# Patient Record
Sex: Male | Born: 1985 | Race: White | Hispanic: No | Marital: Single
Health system: Southern US, Community
[De-identification: ages and names within clinical notes are randomized; demographics above are authoritative.]

---

## 2006-05-02 ENCOUNTER — Emergency Department (HOSPITAL_COMMUNITY): Admission: EM | Admit: 2006-05-02 | Discharge: 2006-05-02 | Payer: Self-pay | Admitting: Emergency Medicine

## 2010-03-01 ENCOUNTER — Encounter: Payer: Self-pay | Admitting: Cardiology

## 2010-04-10 ENCOUNTER — Ambulatory Visit: Payer: Self-pay | Admitting: Cardiology

## 2010-04-10 DIAGNOSIS — R55 Syncope and collapse: Secondary | ICD-10-CM | POA: Insufficient documentation

## 2010-04-12 LAB — CONVERTED CEMR LAB
BUN: 12 mg/dL (ref 6–23)
Basophils Absolute: 0 10*3/uL (ref 0.0–0.1)
GFR calc non Af Amer: 106.54 mL/min (ref >60)
Hemoglobin: 16.6 g/dL (ref 13.0–17.0)
Lymphocytes Relative: 25.9 % (ref 12.0–46.0)
Monocytes Relative: 7.6 % (ref 3.0–12.0)
Neutro Abs: 3.3 10*3/uL (ref 1.4–7.7)
Neutrophils Relative %: 64.9 % (ref 43.0–77.0)
Platelets: 224 10*3/uL (ref 150.0–400.0)
Potassium: 4.1 meq/L (ref 3.5–5.1)
RDW: 12.9 % (ref 11.5–14.6)
TSH: 1.18 microintl units/mL (ref 0.35–5.50)

## 2010-04-26 ENCOUNTER — Encounter: Payer: Self-pay | Admitting: Cardiology

## 2010-04-26 ENCOUNTER — Ambulatory Visit: Payer: Self-pay | Admitting: Internal Medicine

## 2010-04-26 ENCOUNTER — Ambulatory Visit: Payer: Self-pay

## 2010-04-26 ENCOUNTER — Ambulatory Visit (HOSPITAL_COMMUNITY): Admission: RE | Admit: 2010-04-26 | Discharge: 2010-04-26 | Payer: Self-pay | Admitting: Cardiology

## 2010-05-22 ENCOUNTER — Encounter: Payer: Self-pay | Admitting: Cardiology

## 2010-05-25 ENCOUNTER — Encounter: Payer: Self-pay | Admitting: Cardiology

## 2010-05-25 ENCOUNTER — Ambulatory Visit: Payer: Self-pay | Admitting: Cardiology

## 2010-10-25 NOTE — Miscellaneous (Signed)
  Clinical Lists Changes  Observations: Added new observation of ECHOINTERP: Left ventricle: The cavity size was normal. Wall thickness was     normal. Systolic function was normal. The estimated ejection     fraction was in the range of 55% to 60%. Wall motion was normal;     there were no regional wall motion abnormalities.  (04/26/2010 10:02)      Echocardiogram  Procedure date:  04/26/2010  Findings:      Left ventricle: The cavity size was normal. Wall thickness was     normal. Systolic function was normal. The estimated ejection     fraction was in the range of 55% to 60%. Wall motion was normal;     there were no regional wall motion abnormalities.

## 2010-10-25 NOTE — Assessment & Plan Note (Signed)
Summary: np6/syncope/jss   Visit Type:  Initial Consult Primary Provider:  Dr Rodrigo Ran  CC:  syncope.  History of Present Illness: The patient presents for evaluation of syncope. He has had 3 episodes. The first was in 2007  He was shadowing his primary physician in the hospital. He felt hot and had a frank syncopal episode. He had a slight prodrome and then lost consciousness. He was seen in our emergency room. I do not see any blood work or orthostatics at that time. He was felt to be vagal. He had another episode about a year later. Again he was standing and felt presyncopal. He lost consciousness. He was never seen to have seizure activity or loss of bowel or bladder. The most recent was in May of this year. At this time he was at a restaurant and had had 3 or 4 beers. He felt somewhat lightheaded and went to the restroom where he had a syncopal episode. He came out of the restroom after waking up and felt lightheaded falling and striking his head. He was seen at [facility] and had an EKG and CT scan he was told were normal. He was told he was dehydrated. Since that time he has had no further symptoms.  He is active exercising occasionally. He was a Theatre manager for many years. He never had any limitations as a child or young adult. He cannot bring on presyncope. He does have some mild orthostatic symptoms. He will occasionally notice his heart racing but this is fleeting. This is not connected with his syncope. He never has chest pressure, neck or arm discomfort. He never has shortness of breath, PND or orthopnea. He has no weight gain or swelling.  Current Medications (verified): 1)  Pt. Does Not Take Any Meds  Allergies (verified): 1)  ! Sulfa  Past History:  Past Medical History: None  Past Surgical History: Precancerous lesion resected from the left shoulder Tonsillectomy  Family History: His father has borderline diabetes. His mother died at age 80 of  melanoma. There is no family history of syncope, sudden cardiac death, heart failure or early coronary artery disease.  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 He previously played soccer at a high level. He drinks alcohol socially.  Review of Systems       As stated in the HPI and negative for all other systems.   Vital Signs:  Patient profile:   25 year old male Height:      72 inches Weight:      172.50 pounds BMI:     23.48 Pulse rate:   60 / minute Pulse (ortho):   78 / minute Pulse rhythm:   regular Resp:     18 per minute BP standing:   128 / 81  Vitals Entered By: Vikki Ports (April 10, 2010 12:12 PM)  Serial Vital Signs/Assessments:  Time      Position  BP       Pulse  Resp  Temp     By 12:19 PM  Lying LA  129/80   65                    Luz Jaramillo 12:20 PM  Sitting   123/77   58                    Luz Jaramillo 12:21 PM  Standing  128/81  78                    Luz Jaramillo 12:23 PM  Standing  116/81   85                    Luz Jaramillo 12:26 PM  Standing  126/82   87                    Vikki Ports   Physical Exam  General:  Well developed, well nourished, in no acute distress. Head:  normocephalic and atraumatic Eyes:  PERRLA/EOM intact; conjunctiva and lids normal. Mouth:  Teeth, gums and palate normal. Oral mucosa normal. Neck:  Neck supple, no JVD. No masses, thyromegaly or abnormal cervical nodes. Chest Wall:  no deformities or breast masses noted Lungs:  Clear bilaterally to auscultation and percussion. Abdomen:  Bowel sounds positive; abdomen soft and non-tender without masses, organomegaly, or hernias noted. No hepatosplenomegaly. Msk:  Back normal, normal gait. Muscle strength and tone normal. Extremities:  No clubbing or cyanosis. Neurologic:  Alert and oriented x 3. Skin:  Intact without lesions or rashes. Cervical Nodes:  no significant adenopathy Axillary Nodes:  no significant  adenopathy Inguinal Nodes:  no significant adenopathy Psych:  Normal affect.   Detailed Cardiovascular Exam  Neck    Carotids: Carotids full and equal bilaterally without bruits.      Neck Veins: Normal, no JVD.    Heart    Inspection: no deformities or lifts noted.      Palpation: normal PMI with no thrills palpable.      Auscultation: regular rate and rhythm, S1, S2 without murmurs, rubs, gallops, or clicks.    Vascular    Abdominal Aorta: no palpable masses, pulsations, or audible bruits.      Femoral Pulses: normal femoral pulses bilaterally.      Pedal Pulses: normal pedal pulses bilaterally.      Radial Pulses: normal radial pulses bilaterally.      Peripheral Circulation: no clubbing, cyanosis, or edema noted with normal capillary refill.     EKG  Procedure date:  04/10/2010  Findings:      Sinus rhythm with sinus arrhythmia, early repolarization changes, poor anterior R-wave progression, no acute ST-T wave changes  Impression & Recommendations:  Problem # 1:  SYNCOPE (ICD-780.2) The patient has syncopal episodes as described. These are most likely vagal. However, I will order some routine labs and an echocardiogram. I will have a low threshold for event monitoring given his palpitations. We did discuss avoidance and treatment of vagal syncope. Orders: EKG w/ Interpretation (93000) TLB-BMP (Basic Metabolic Panel-BMET) (80048-METABOL) TLB-TSH (Thyroid Stimulating Hormone) (84443-TSH) TLB-CBC Platelet - w/Differential (85025-CBCD) Echocardiogram (Echo)  Patient Instructions: 1)  Your physician recommends that you schedule a follow-up appointment in: 6 weeks with Dr Antoine Poche 2)  Your physician recommends that you have lab work today 3)  Your physician recommends that you continue on your current medications as directed. Please refer to the Current Medication list given to you today. 4)  Your physician has requested that you have an echocardiogram.  Echocardiography  is a painless test that uses sound waves to create images of your heart. It provides your doctor with information about the size and shape of your heart and how well your heart's chambers and valves are working.  This procedure takes approximately one hour. There are no restrictions for this procedure.

## 2010-10-25 NOTE — Letter (Signed)
Summary: [facility] Phone Note   [facility] Phone Note   Imported By: Roderic Ovens 04/18/2010 09:36:40  _____________________________________________________________________  External Attachment:    Type:   Image     Comment:   External Document

## 2010-10-25 NOTE — Assessment & Plan Note (Signed)
Summary: [facility]   Visit Type:  Follow-up Primary Melinda Gwinner:  Dr Rodrigo Ran  CC:  syncope.  History of Present Illness: The patient presents for followup of the above. Since I last saw him he has had no frank syncopal episodes. He did have an episode of near syncope while riding roller coasters. He continues to get some mild orthostatic symptoms. He has not noticed any palpitations, presyncope or syncope. We did have a long discussion today about anxiety as he does report he's got stress in his life. He thinks he does become anxious. He is not having any chest pressure or shortness of breath.  Current Medications (verified): 1)  Pt. Does Not Take Any Meds  Allergies (verified): 1)  ! Sulfa  Past History:  Past Medical History: Reviewed history from 04/10/2010 and no changes required. Syncope Vertigo  Review of Systems       As stated in the HPI and negative for all other systems.   Vital Signs:  Patient profile:   25 year old male Height:      72 inches Weight:      162 pounds BMI:     22.05 Pulse rate:   62 / minute Resp:     16 per minute BP sitting:   142 / 87  (right arm)  Vitals Entered By: Marrion Coy, CNA (May 25, 2010 2:26 PM)  Physical Exam  General:  Well developed, well nourished, in no acute distress. Head:  normocephalic and atraumatic Eyes:  PERRLA/EOM intact; conjunctiva and lids normal. Neck:  Neck supple, no JVD. No masses, thyromegaly or abnormal cervical nodes. Chest Wall:  no deformities or breast masses noted Lungs:  Clear bilaterally to auscultation and percussion. Heart:  Non-displaced PMI, chest non-tender; regular rate and rhythm, S1, S2 without murmurs, rubs or gallops. Carotid upstroke normal, no bruit. Normal abdominal aortic size, no bruits. Femorals normal pulses, no bruits. Pedals normal pulses. No edema, no varicosities. Abdomen:  Bowel sounds positive; abdomen soft and non-tender without masses, organomegaly, or  hernias noted. No hepatosplenomegaly. Msk:  Back normal, normal gait. Muscle strength and tone normal. Pulses:  pulses normal in all 4 extremities Extremities:  No clubbing or cyanosis. Neurologic:  Alert and oriented x 3. Skin:  Intact without lesions or rashes. Cervical Nodes:  no significant adenopathy Psych:  Normal affect.   Impression & Recommendations:  Problem # 1:  SYNCOPE (ICD-780.2) I think this patient is having vagal syncope. I think this is coupled with a generalized anxiety disorder. We spent a long time discussing this and I will call his primary Refoel Palladino to further discuss. He also thinks he has anxiety issues. He also describes generalized fatigue and a lower sense of well-being since he stopped exercising routinely. We spent quite a bit of time discussing this and the benefits of an exercise regimen such as league soccer or running. I encouraged him to explore this as a component of his therapy. However, I do not think he has any structural heart disease or risk of sudden cardiac death. No further cardiovascular testing is suggested.  Patient Instructions: 1)  Your physician recommends that you schedule a follow-up appointment as needed 2)  Your physician recommends that you continue on your current medications as directed. Please refer to the Current Medication list given to you today.

## 2010-12-09 ENCOUNTER — Emergency Department (HOSPITAL_COMMUNITY)
Admission: EM | Admit: 2010-12-09 | Discharge: 2010-12-10 | Disposition: A | Discharge status: Home / Self Care | Payer: 59 | Attending: Emergency Medicine | Admitting: Emergency Medicine

## 2010-12-09 ENCOUNTER — Emergency Department (HOSPITAL_COMMUNITY): Payer: 59

## 2010-12-09 DIAGNOSIS — R1013 Epigastric pain: Secondary | ICD-10-CM | POA: Insufficient documentation

## 2010-12-09 DIAGNOSIS — R112 Nausea with vomiting, unspecified: Secondary | ICD-10-CM | POA: Insufficient documentation

## 2010-12-09 LAB — CBC
HCT: 46.9 % (ref 39.0–52.0)
Hemoglobin: 17.3 g/dL — ABNORMAL HIGH (ref 13.0–17.0)
MCV: 87.8 fL (ref 78.0–100.0)
Platelets: 178 10*3/uL (ref 150–400)
RBC: 5.34 MIL/uL (ref 4.22–5.81)
WBC: 12.5 10*3/uL — ABNORMAL HIGH (ref 4.0–10.5)

## 2010-12-09 LAB — DIFFERENTIAL
Lymphocytes Relative: 1 % — ABNORMAL LOW (ref 12–46)
Lymphs Abs: 0.2 10*3/uL — ABNORMAL LOW (ref 0.7–4.0)
Neutro Abs: 11.8 10*3/uL — ABNORMAL HIGH (ref 1.7–7.7)
Neutrophils Relative %: 95 % — ABNORMAL HIGH (ref 43–77)

## 2010-12-09 LAB — COMPREHENSIVE METABOLIC PANEL
AST: 21 U/L (ref 0–37)
CO2: 23 mEq/L (ref 19–32)
Calcium: 9.7 mg/dL (ref 8.4–10.5)
Creatinine, Ser: 1.07 mg/dL (ref 0.4–1.5)
GFR calc Af Amer: >60 mL/min (ref >60)
GFR calc non Af Amer: >60 mL/min (ref >60)
Total Protein: 7.5 g/dL (ref 6.0–8.3)

## 2011-12-13 IMAGING — US US ABDOMEN COMPLETE
1 series · 14 of 25 positions shown · non-contrast
Comparison: None

CLINICAL DATA: Abdominal pain

COMPLETE ABDOMINAL ULTRASOUND

[Series 1: us abdomen complete · 0.28mm/px · 14 of 71 slices shown]
[im 1/71]
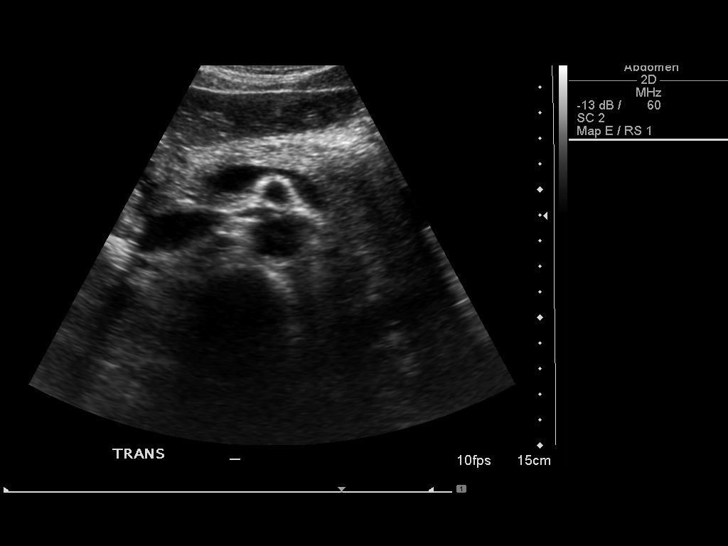
[im 6/71]
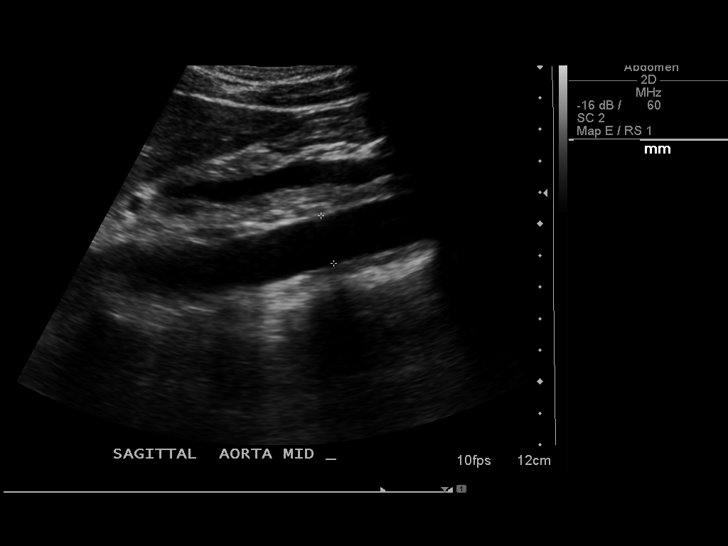
[im 12/71]
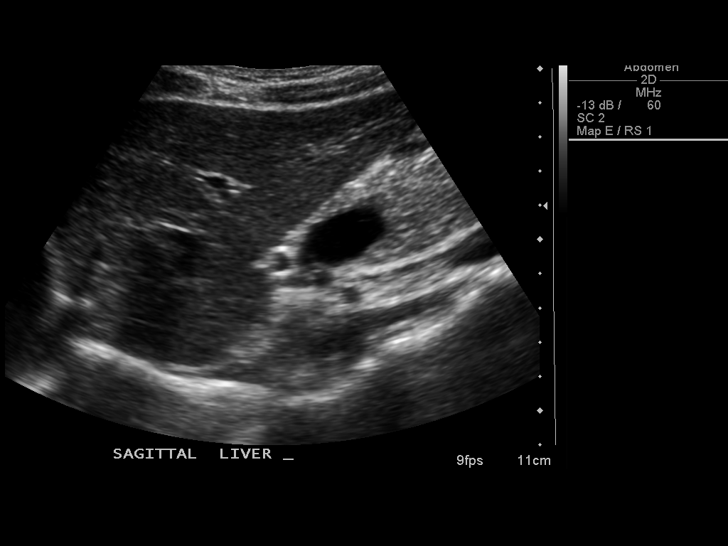
[im 18/71]
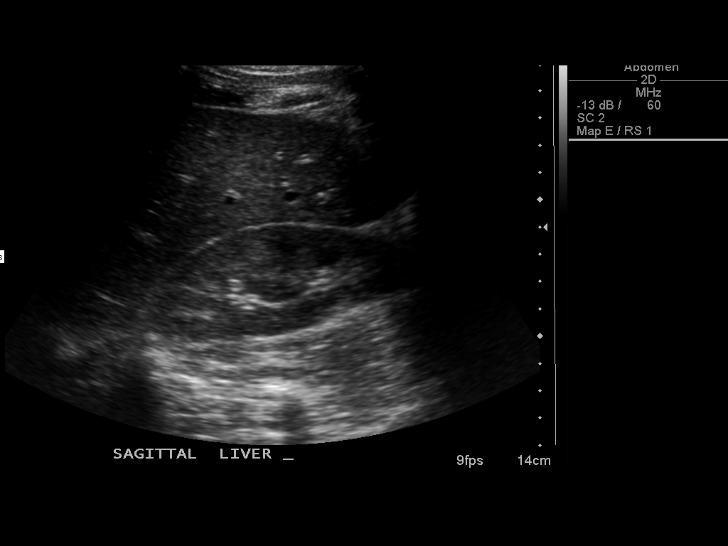
[im 24/71]
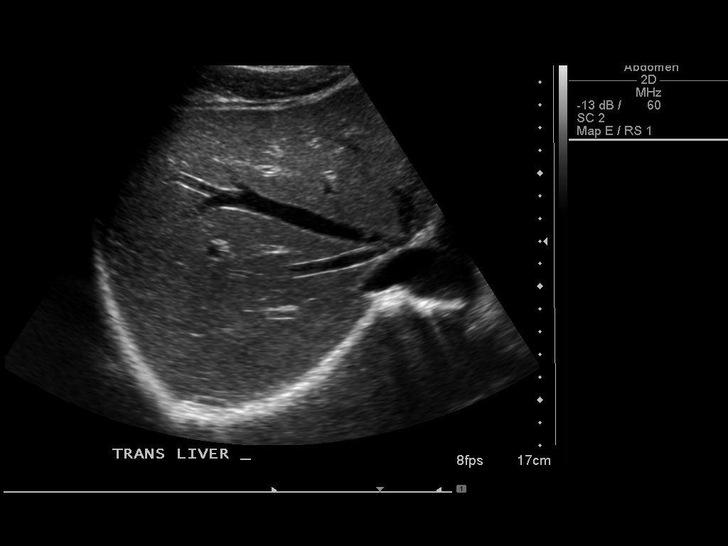
[im 27/71]
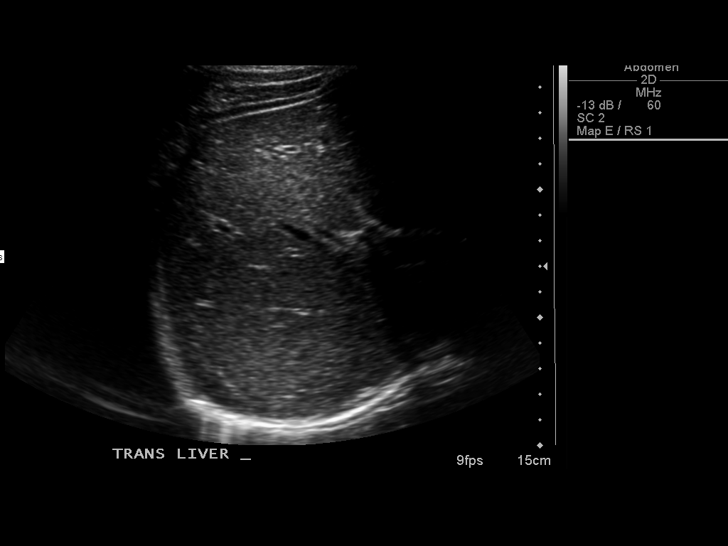
[im 33/71]
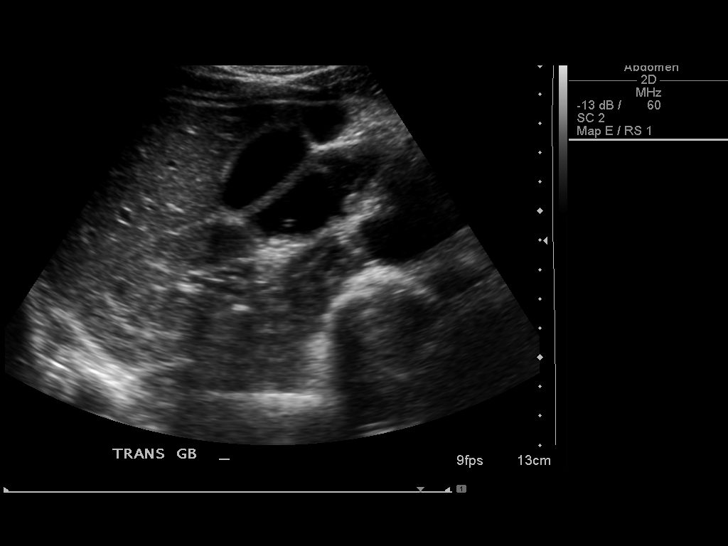
[im 38/71]
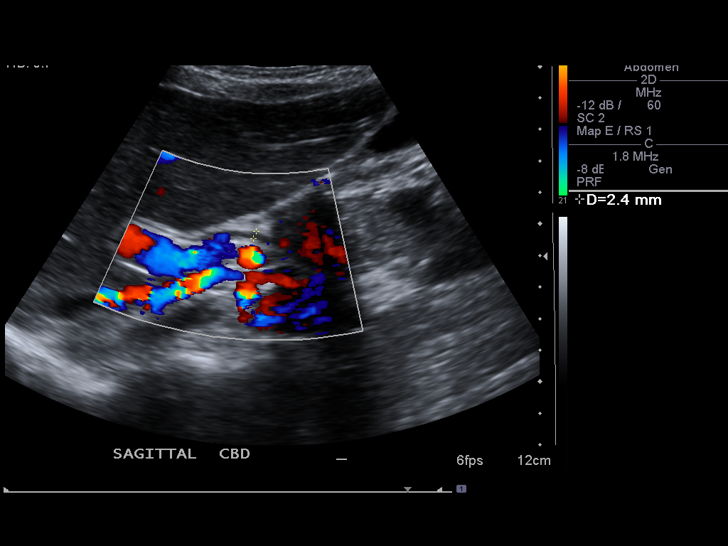
[im 44/71]
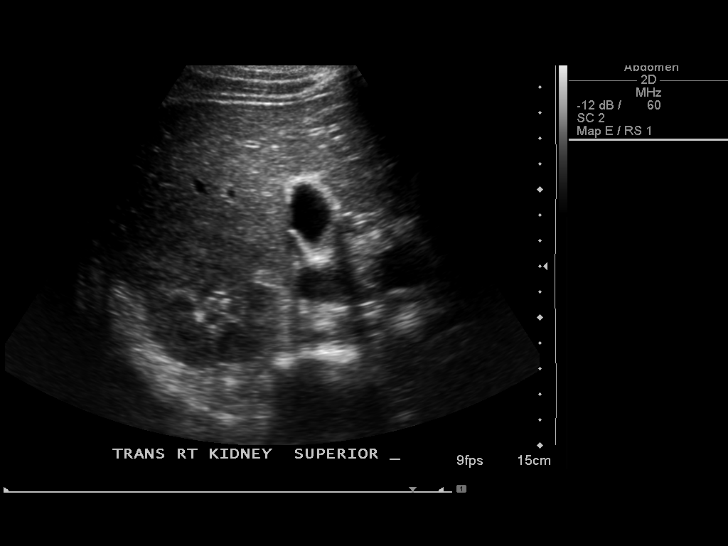
[im 47/71]
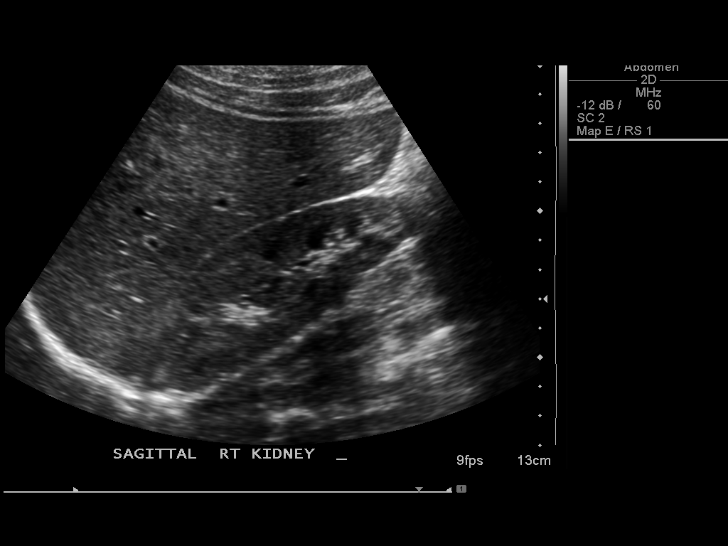
[im 53/71]
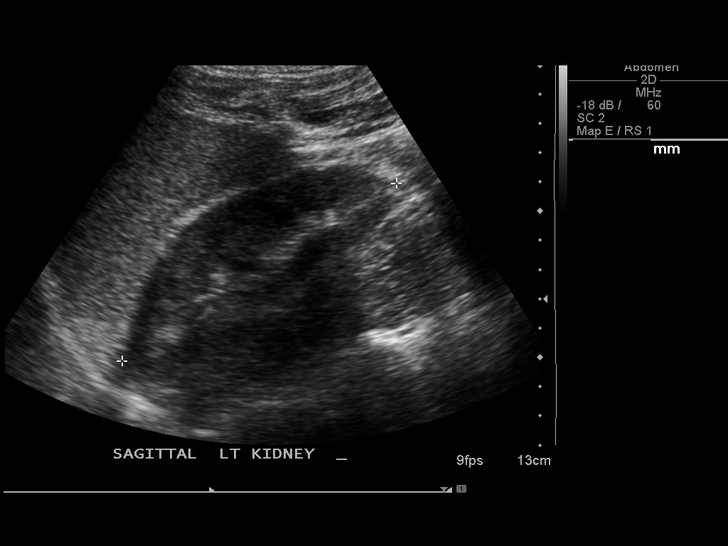
[im 59/71]
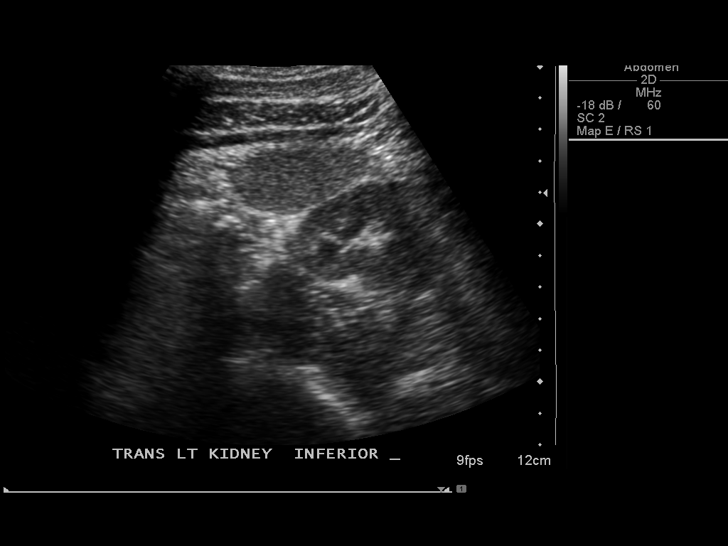
[im 65/71]
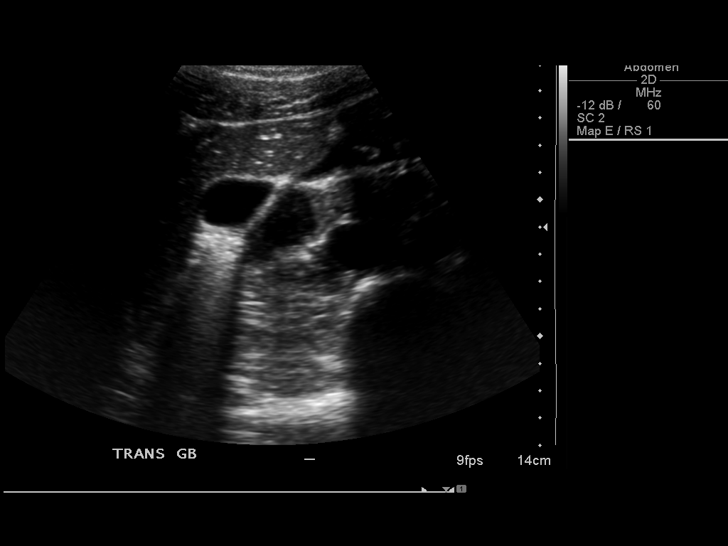
[im 71/71]
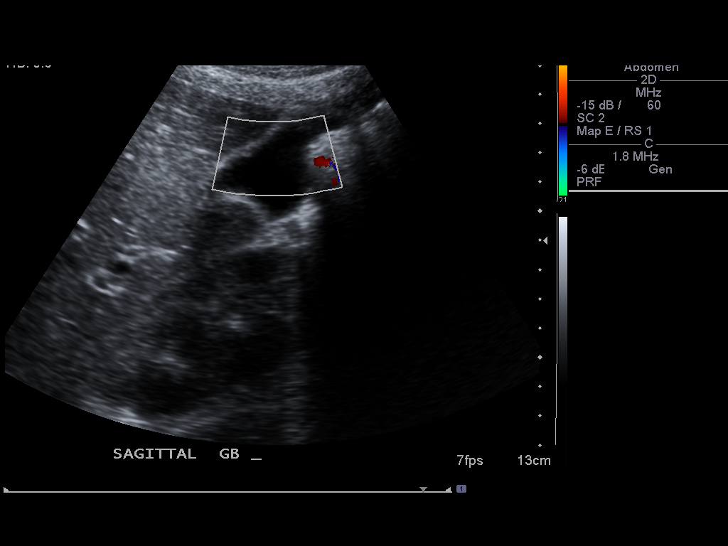

[14 of 25 positions shown; findings below may reference images not displayed]

FINDINGS: Gallbladder:  3 mm polyp versus less likely nonshadowing adherent
stone identified within the gallbladder. Sonographic Murphy's sign
was not elicited.  No wall thickening or pericholecystic fluid.

Common bile duct: Normal, 2 mm

Liver: Negative

IVC: Negative

Pancreas:  Negative

Spleen:  Borderline splenomegaly.  12.2 x 12.7 x 5.2 cm.

Right Kidney:  11.3 cm. No hydronephrosis.

Left Kidney:  11.2 cm. No hydronephrosis.

Abdominal aorta:  Nonaneurysmal without ascites.
IMPRESSION: 1.  No acute process or explanation for abdominal pain.
2.  Gallbladder polyp versus less likely adherent nonshadowing
stone.  No evidence of acute cholecystitis.
3.  Borderline splenomegaly.

## 2015-03-02 ENCOUNTER — Ambulatory Visit (HOSPITAL_COMMUNITY)
Admission: RE | Admit: 2015-03-02 | Discharge: 2015-03-02 | Disposition: A | Discharge status: Home / Self Care | Payer: 59 | Source: Ambulatory Visit | Attending: Orthopedic Surgery | Admitting: Orthopedic Surgery

## 2015-03-02 ENCOUNTER — Other Ambulatory Visit (HOSPITAL_COMMUNITY): Payer: Self-pay | Admitting: Orthopedic Surgery

## 2015-03-02 DIAGNOSIS — Z1389 Encounter for screening for other disorder: Secondary | ICD-10-CM | POA: Diagnosis present

## 2015-03-02 DIAGNOSIS — Z139 Encounter for screening, unspecified: Secondary | ICD-10-CM

## 2016-03-05 IMAGING — CR DG ORBITS FOR FOREIGN BODY
2 series · 2 of 2 positions shown · non-contrast
Comparison: None.

CLINICAL DATA: Metal working/exposure; clearance prior to MRI

EXAM:
ORBITS FOR FOREIGN BODY - 2 VIEW

[w waters (1 of 2)]
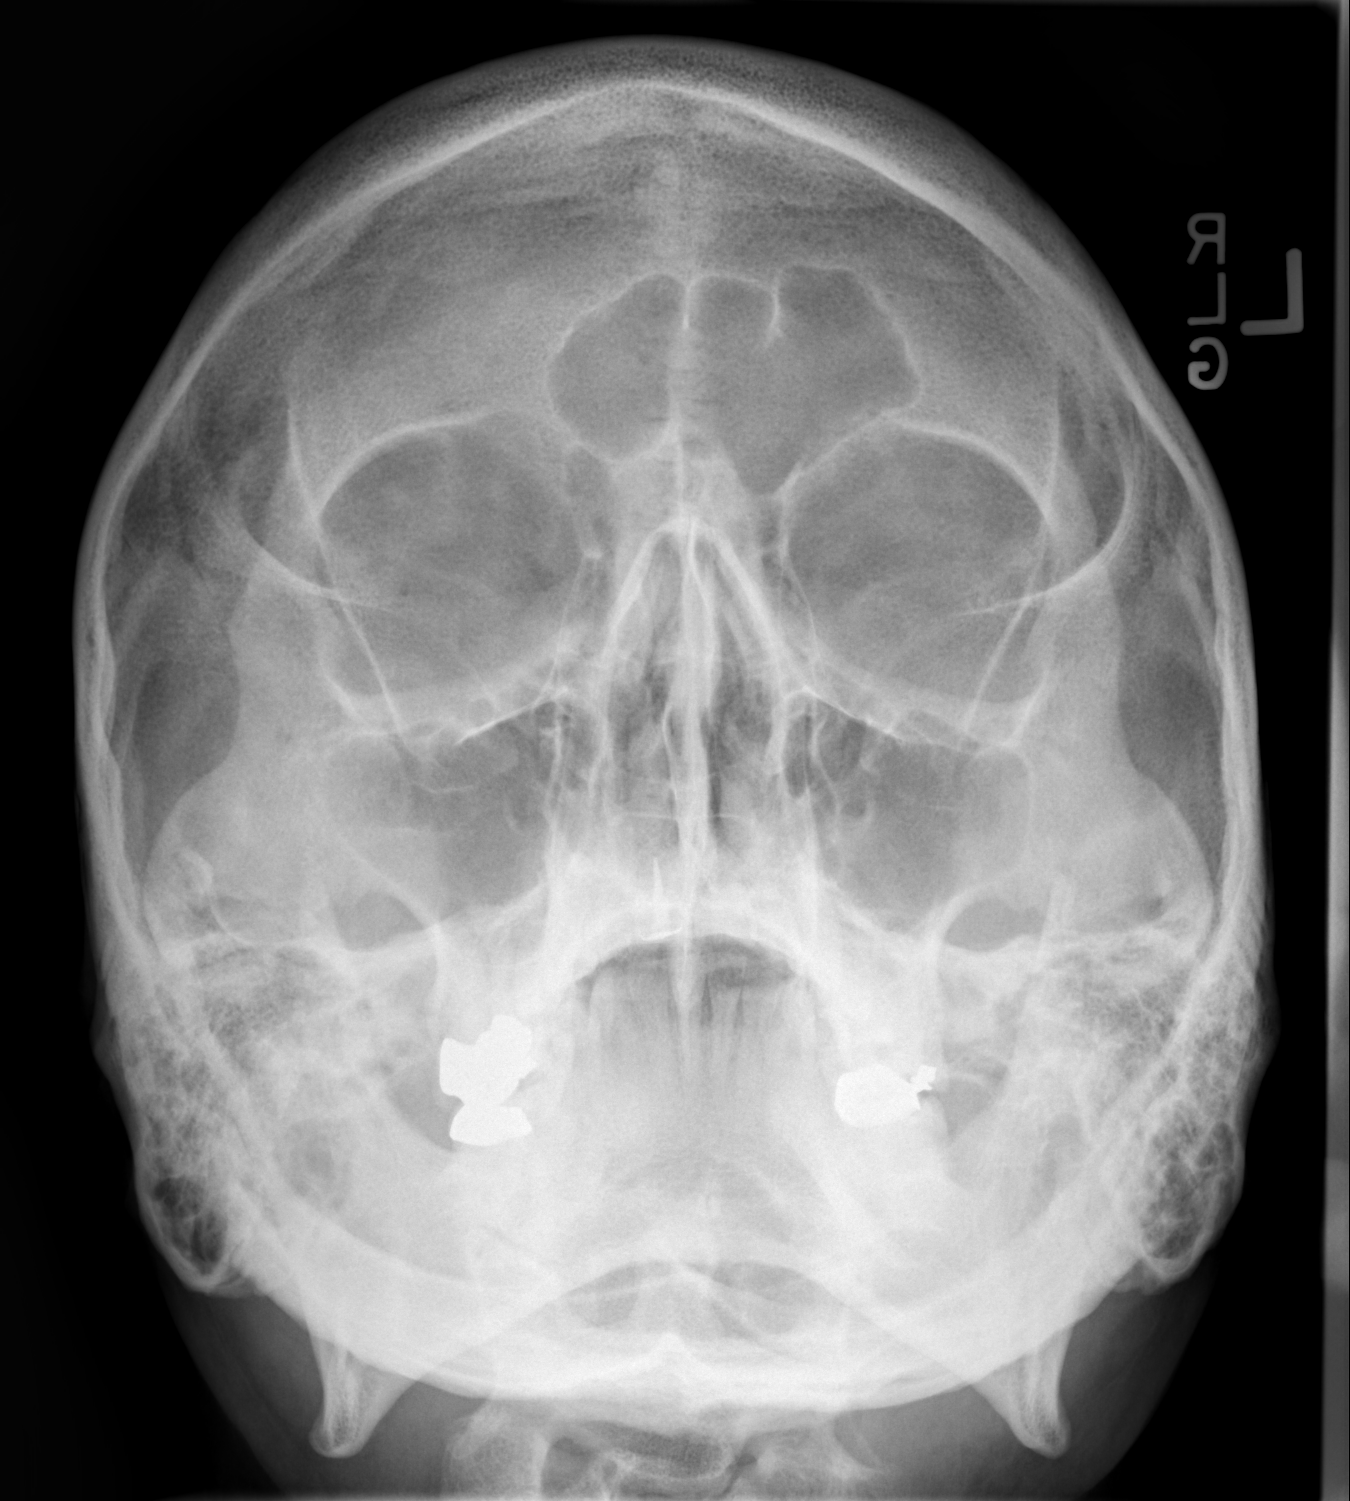

[w waters (2 of 2)]
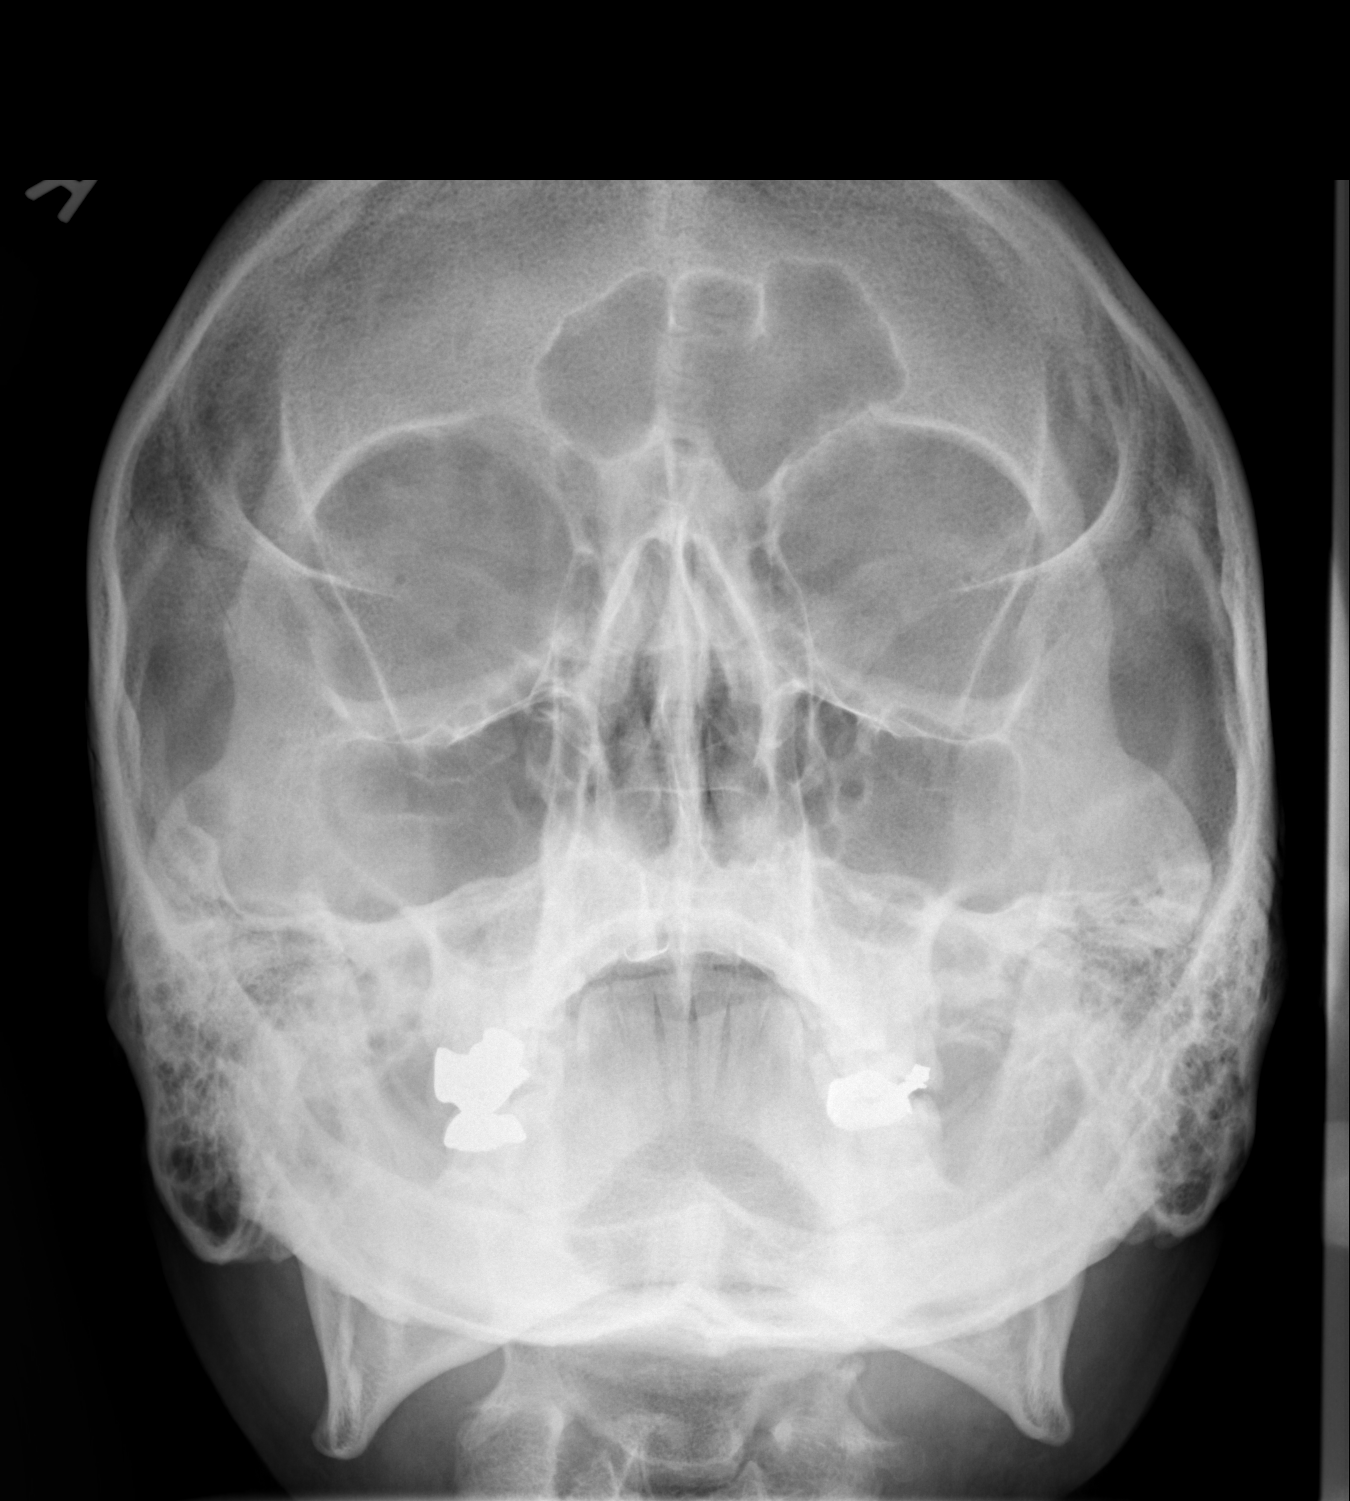

[2 of 2 positions shown; findings below may reference images not displayed]

FINDINGS: There is no evidence of metallic foreign body within the orbits. No
significant bone abnormality identified. The paranasal sinuses are
clear.
IMPRESSION: No evidence of metallic foreign body within the orbits.

## 2016-09-30 DIAGNOSIS — M6740 Ganglion, unspecified site: Secondary | ICD-10-CM | POA: Diagnosis not present

## 2016-09-30 DIAGNOSIS — Z0000 Encounter for general adult medical examination without abnormal findings: Secondary | ICD-10-CM | POA: Diagnosis not present

## 2016-09-30 DIAGNOSIS — J302 Other seasonal allergic rhinitis: Secondary | ICD-10-CM | POA: Diagnosis not present

## 2016-09-30 DIAGNOSIS — R946 Abnormal results of thyroid function studies: Secondary | ICD-10-CM | POA: Diagnosis not present

## 2016-09-30 DIAGNOSIS — E538 Deficiency of other specified B group vitamins: Secondary | ICD-10-CM | POA: Diagnosis not present

## 2016-11-11 DIAGNOSIS — J028 Acute pharyngitis due to other specified organisms: Secondary | ICD-10-CM | POA: Diagnosis not present

## 2016-11-11 DIAGNOSIS — J328 Other chronic sinusitis: Secondary | ICD-10-CM | POA: Diagnosis not present

## 2016-11-11 DIAGNOSIS — R0989 Other specified symptoms and signs involving the circulatory and respiratory systems: Secondary | ICD-10-CM | POA: Diagnosis not present

## 2017-09-29 DIAGNOSIS — Z0000 Encounter for general adult medical examination without abnormal findings: Secondary | ICD-10-CM | POA: Diagnosis not present

## 2017-09-29 DIAGNOSIS — R946 Abnormal results of thyroid function studies: Secondary | ICD-10-CM | POA: Diagnosis not present

## 2017-09-29 DIAGNOSIS — E538 Deficiency of other specified B group vitamins: Secondary | ICD-10-CM | POA: Diagnosis not present

## 2017-09-29 DIAGNOSIS — R82998 Other abnormal findings in urine: Secondary | ICD-10-CM | POA: Diagnosis not present

## 2017-10-06 DIAGNOSIS — E538 Deficiency of other specified B group vitamins: Secondary | ICD-10-CM | POA: Diagnosis not present

## 2017-10-06 DIAGNOSIS — J302 Other seasonal allergic rhinitis: Secondary | ICD-10-CM | POA: Diagnosis not present

## 2017-10-06 DIAGNOSIS — R946 Abnormal results of thyroid function studies: Secondary | ICD-10-CM | POA: Diagnosis not present

## 2017-10-06 DIAGNOSIS — Z23 Encounter for immunization: Secondary | ICD-10-CM | POA: Diagnosis not present

## 2017-10-06 DIAGNOSIS — Z0000 Encounter for general adult medical examination without abnormal findings: Secondary | ICD-10-CM | POA: Diagnosis not present

## 2017-10-06 DIAGNOSIS — J328 Other chronic sinusitis: Secondary | ICD-10-CM | POA: Diagnosis not present

## 2017-11-18 DIAGNOSIS — J302 Other seasonal allergic rhinitis: Secondary | ICD-10-CM | POA: Diagnosis not present

## 2017-11-18 DIAGNOSIS — R05 Cough: Secondary | ICD-10-CM | POA: Diagnosis not present

## 2018-01-22 DIAGNOSIS — Z23 Encounter for immunization: Secondary | ICD-10-CM | POA: Diagnosis not present

## 2018-06-16 DIAGNOSIS — J111 Influenza due to unidentified influenza virus with other respiratory manifestations: Secondary | ICD-10-CM | POA: Diagnosis not present

## 2018-06-16 DIAGNOSIS — Z23 Encounter for immunization: Secondary | ICD-10-CM | POA: Diagnosis not present

## 2018-06-16 DIAGNOSIS — K529 Noninfective gastroenteritis and colitis, unspecified: Secondary | ICD-10-CM | POA: Diagnosis not present

## 2018-07-14 DIAGNOSIS — C44519 Basal cell carcinoma of skin of other part of trunk: Secondary | ICD-10-CM | POA: Diagnosis not present

## 2018-07-14 DIAGNOSIS — D2361 Other benign neoplasm of skin of right upper limb, including shoulder: Secondary | ICD-10-CM | POA: Diagnosis not present

## 2018-07-14 DIAGNOSIS — Z85828 Personal history of other malignant neoplasm of skin: Secondary | ICD-10-CM | POA: Diagnosis not present

## 2018-07-14 DIAGNOSIS — L570 Actinic keratosis: Secondary | ICD-10-CM | POA: Diagnosis not present

## 2018-07-14 DIAGNOSIS — L218 Other seborrheic dermatitis: Secondary | ICD-10-CM | POA: Diagnosis not present

## 2018-10-30 DIAGNOSIS — R82998 Other abnormal findings in urine: Secondary | ICD-10-CM | POA: Diagnosis not present

## 2018-10-30 DIAGNOSIS — Z0000 Encounter for general adult medical examination without abnormal findings: Secondary | ICD-10-CM | POA: Diagnosis not present

## 2018-10-30 DIAGNOSIS — E538 Deficiency of other specified B group vitamins: Secondary | ICD-10-CM | POA: Diagnosis not present

## 2018-10-30 DIAGNOSIS — R946 Abnormal results of thyroid function studies: Secondary | ICD-10-CM | POA: Diagnosis not present

## 2018-11-06 DIAGNOSIS — J302 Other seasonal allergic rhinitis: Secondary | ICD-10-CM | POA: Diagnosis not present

## 2018-11-06 DIAGNOSIS — E538 Deficiency of other specified B group vitamins: Secondary | ICD-10-CM | POA: Diagnosis not present

## 2018-11-06 DIAGNOSIS — Z0000 Encounter for general adult medical examination without abnormal findings: Secondary | ICD-10-CM | POA: Diagnosis not present

## 2018-11-06 DIAGNOSIS — R946 Abnormal results of thyroid function studies: Secondary | ICD-10-CM | POA: Diagnosis not present

## 2018-11-06 DIAGNOSIS — M6740 Ganglion, unspecified site: Secondary | ICD-10-CM | POA: Diagnosis not present

## 2018-11-06 DIAGNOSIS — Z23 Encounter for immunization: Secondary | ICD-10-CM | POA: Diagnosis not present

## 2022-12-29 ENCOUNTER — Ambulatory Visit: Payer: Self-pay

## 2022-12-29 ENCOUNTER — Ambulatory Visit (INDEPENDENT_AMBULATORY_CARE_PROVIDER_SITE_OTHER): Payer: 59

## 2022-12-29 ENCOUNTER — Encounter: Payer: Self-pay | Admitting: Emergency Medicine

## 2022-12-29 ENCOUNTER — Other Ambulatory Visit: Payer: Self-pay

## 2022-12-29 ENCOUNTER — Ambulatory Visit
Admission: EM | Admit: 2022-12-29 | Discharge: 2022-12-29 | Disposition: A | Discharge status: Home / Self Care | Payer: 59 | Attending: Internal Medicine | Admitting: Internal Medicine

## 2022-12-29 DIAGNOSIS — M25572 Pain in left ankle and joints of left foot: Secondary | ICD-10-CM

## 2022-12-29 NOTE — ED Provider Notes (Addendum)
[facility]    CSN: [id number] Arrival date & time: 12/29/22  0915      History   Chief Complaint Chief Complaint  Patient presents with   Ankle Pain    HPI Paul Horn is a 37 y.o. male.   Patient presents with left ankle/left lower leg pain that started yesterday.  Patient denies any obvious injury.  Reports that it started randomly.  Denies any previous injuries.  Patient reports that he did go fishing yesterday and was jumping in and out of the boat.  He is not sure if this is related.  Patient not reporting any numbness or tingling.  Patient has taken Advil for symptoms with minimal improvement. Denies any fever.    Ankle Pain   History reviewed. No pertinent past medical history.  Patient Active Problem List   Diagnosis Date Noted   SYNCOPE 04/10/2010    History reviewed. No pertinent surgical history.     Home Medications    Prior to Admission medications   Not on File    Family History History reviewed. No pertinent family history.  Social History     Allergies   Sulfonamide derivatives   Review of Systems Review of Systems Per HPI  Physical Exam Triage Vital Signs ED Triage Vitals  Enc Vitals Group     BP 12/29/22 0928 (!) 136/92     Pulse Rate 12/29/22 0928 71     Resp 12/29/22 0928 18     Temp 12/29/22 0928 98 F (36.7 C)     Temp Source 12/29/22 0928 Oral     SpO2 12/29/22 0928 98 %     Weight --      Height --      Head Circumference --      Peak Flow --      Pain Score 12/29/22 0929 4     Pain Loc --      Pain Edu? --      Excl. in GC? --    No data found.  Updated Vital Signs BP (!) 136/92 (BP Location: Left Arm)   Pulse 71   Temp 98 F (36.7 C) (Oral)   Resp 18   SpO2 98%   Visual Acuity Right Eye Distance:   Left Eye Distance:   Bilateral Distance:    Right Eye Near:   Left Eye Near:    Bilateral Near:     Physical Exam Constitutional:      General: He is not in acute distress.     Appearance: Normal appearance. He is not toxic-appearing or diaphoretic.  HENT:     Head: Normocephalic and atraumatic.  Eyes:     Extraocular Movements: Extraocular movements intact.     Conjunctiva/sclera: Conjunctivae normal.  Pulmonary:     Effort: Pulmonary effort is normal.  Musculoskeletal:       Legs:     Comments: Patient reporting pain is present at the circled area on the diagram at the anterior ankle/lower leg.  There is no obvious swelling or discoloration present.  No lacerations or abrasions.  Patient reports plantarflexion of the foot elicits pain.  Otherwise, full range of motion of ankle present.  Capillary refill and pulses are intact.  Neurovascularly intact.  Neurological:     General: No focal deficit present.     Mental Status: He is alert and oriented to person, place, and time. Mental status is at baseline.  Psychiatric:        Mood and  Affect: Mood normal.        Behavior: Behavior normal.        Thought Content: Thought content normal.        Judgment: Judgment normal.      UC Treatments / Results  Labs (all labs ordered are listed, but only abnormal results are displayed) Labs Reviewed - No data to display  EKG   Radiology DG Ankle Complete Left  Result Date: 12/29/2022 CLINICAL DATA:  Left ankle pain for 3 days.  No known injury. EXAM: LEFT ANKLE COMPLETE - 3+ VIEW COMPARISON:  None Available. FINDINGS: There is no evidence of fracture, dislocation, or joint effusion. There is no evidence of arthropathy or other focal bone abnormality. Soft tissues are unremarkable. IMPRESSION: Negative. Electronically Signed   By: Danae Orleans M.D.   On: 12/29/2022 10:37    Procedures Procedures (including critical care time)  Medications Ordered in UC Medications - No data to display  Initial Impression / Assessment and Plan / UC Course  I have reviewed the triage vital signs and the nursing notes.  Pertinent labs & imaging results that were available during  my care of the patient were reviewed by me and considered in my medical decision making (see chart for details).     Left lower leg/ankle x-ray is negative for any bony abnormality. Lower tib/fib visualized as well with no obvious bony abnormality.  Suspect muscular cause to symptoms.  No obvious signs of infection or gout on exam.  Advised patient of supportive care, ice application, elevation of extremity, NSAIDs as needed.  Advised him to follow-up with orthopedist at provided contact information if symptoms persist or worsen.  Offered patient ankle brace but he declined stating that he had these at home.  Patient verbalized understanding and was agreeable with plan. Final Clinical Impressions(s) / UC Diagnoses   Final diagnoses:  Acute left ankle pain     Discharge Instructions      X-ray was normal.  Suspect muscular pain.  Follow-up with Ortho if symptoms persist or worsen.  Apply ice and take ibuprofen as needed.     ED Prescriptions   None    PDMP not reviewed this encounter.   Gustavus Bryant, [place] 12/29/22 1052    Gustavus Bryant, [place] 12/29/22 1052

## 2022-12-29 NOTE — ED Triage Notes (Signed)
Pt sts left ankle pain x 3 days; denies obvious injury

## 2022-12-29 NOTE — Discharge Instructions (Signed)
X-ray was normal.  Suspect muscular pain.  Follow-up with Ortho if symptoms persist or worsen.  Apply ice and take ibuprofen as needed.
